# Patient Record
Sex: Female | Born: 1960 | Race: White | Hispanic: No | Marital: Married | Smoking: Current every day smoker
Health system: Southern US, Community
[De-identification: ages and names within clinical notes are randomized; demographics above are authoritative.]

## PROBLEM LIST (undated history)

## (undated) DIAGNOSIS — F419 Anxiety disorder, unspecified: Secondary | ICD-10-CM

## (undated) HISTORY — PX: TUBAL LIGATION: SHX77

---

## 2012-07-23 ENCOUNTER — Emergency Department (HOSPITAL_COMMUNITY)
Admission: EM | Admit: 2012-07-23 | Discharge: 2012-07-23 | Disposition: A | Discharge status: Home / Self Care | Payer: Self-pay | Attending: Emergency Medicine | Admitting: Emergency Medicine

## 2012-07-23 ENCOUNTER — Encounter (HOSPITAL_COMMUNITY): Payer: Self-pay | Admitting: Emergency Medicine

## 2012-07-23 ENCOUNTER — Emergency Department (HOSPITAL_COMMUNITY): Payer: Self-pay

## 2012-07-23 DIAGNOSIS — R000 Tachycardia, unspecified: Secondary | ICD-10-CM | POA: Insufficient documentation

## 2012-07-23 DIAGNOSIS — J019 Acute sinusitis, unspecified: Secondary | ICD-10-CM | POA: Insufficient documentation

## 2012-07-23 DIAGNOSIS — J329 Chronic sinusitis, unspecified: Secondary | ICD-10-CM

## 2012-07-23 DIAGNOSIS — F17200 Nicotine dependence, unspecified, uncomplicated: Secondary | ICD-10-CM | POA: Insufficient documentation

## 2012-07-23 DIAGNOSIS — F411 Generalized anxiety disorder: Secondary | ICD-10-CM | POA: Insufficient documentation

## 2012-07-23 DIAGNOSIS — R229 Localized swelling, mass and lump, unspecified: Secondary | ICD-10-CM | POA: Insufficient documentation

## 2012-07-23 HISTORY — DX: Anxiety disorder, unspecified: F41.9

## 2012-07-23 MED ORDER — SULFAMETHOXAZOLE-TRIMETHOPRIM 800-160 MG PO TABS: 1.0000 | ORAL_TABLET | Freq: Two times a day (BID) | ORAL | Status: DC

## 2012-07-23 MED ORDER — DEXTROMETHORPHAN POLISTIREX 30 MG/5ML PO LQCR: 60.0000 mg | ORAL | Status: DC | PRN

## 2012-07-23 MED ORDER — NAPROXEN 500 MG PO TABS: 500.0000 mg | ORAL_TABLET | Freq: Two times a day (BID) | ORAL | Status: DC

## 2012-07-23 NOTE — ED Notes (Signed)
Per pt, moved here from [place], found black mold in house they are renting-starting coughing, dark green mucous

## 2012-07-23 NOTE — Progress Notes (Signed)
[facility] has seen patient and provided her with a oc application. °

## 2012-07-23 NOTE — ED Provider Notes (Signed)
   History    CSN: [id number] Arrival date & time 07/23/12  4098  First MD Initiated Contact with Patient 07/23/12 [phone]     Chief Complaint  Patient presents with  . Cough   (Consider location/radiation/quality/duration/timing/severity/associated sxs/prior Treatment) HPI Comments: Patient is a 52 year old female who presents with a 1 week history of cough. Symptoms started gradually and progressively worsened since the onset. Patient reports moving here from [place] recently and found black mold in the house she is renting. Patient is is afraid she is sick due to the mold. Patient reports productive cough with dark green mucous. Patient reports associated sinus pressure and and congestion. No aggravating/alleviating factors. No other associated symptoms.   Past Medical History  Diagnosis Date  . Anxiety    Past Surgical History  Procedure Laterality Date  . Tubal ligation     No family history on file. History  Substance Use Topics  . Smoking status: Current Every Day Smoker  . Smokeless tobacco: Not on file  . Alcohol Use: No   OB History   Grav Para Term Preterm Abortions TAB SAB Ect Mult Living                 Review of Systems  HENT: Positive for sinus pressure.   Respiratory: Positive for cough.   All other systems reviewed and are negative.    Allergies  Review of patient's allergies indicates not on file.  Home Medications  No current outpatient prescriptions on file. BP 131/93  Pulse 120  Temp(Src) 98.8 F (37.1 C) (Oral)  Resp 18  SpO2 98% Physical Exam  Nursing note and vitals reviewed. Constitutional: She is oriented to person, place, and time. She appears well-developed and well-nourished. No distress.  Patient coughing throughout exam.   HENT:  Head: Normocephalic and atraumatic.  Mouth/Throat: Oropharynx is clear and moist. No oropharyngeal exudate.  Maxillary sinus tenderness to palpation. Mild periorbital swelling noted.   Eyes:  Conjunctivae and EOM are normal.  Neck: Normal range of motion.  Cardiovascular: Normal rate and regular rhythm.  Exam reveals no gallop and no friction rub.   No murmur heard. Pulmonary/Chest: Effort normal and breath sounds normal. She has no wheezes. She has no rales. She exhibits no tenderness.  Abdominal: Soft. She exhibits no distension. There is no tenderness. There is no rebound and no guarding.  Musculoskeletal: Normal range of motion.  Neurological: She is alert and oriented to person, place, and time. Coordination normal.  Speech is goal-oriented. Moves limbs without ataxia.   Skin: Skin is warm and dry.  Psychiatric: She has a normal mood and affect. Her behavior is normal.    ED Course  Procedures (including critical care time) Labs Reviewed - No data to display Dg Chest 2 View  07/23/2012   *RADIOLOGY REPORT*  Clinical Data: Cough and fever.  CHEST - 2 VIEW  Comparison: None.  Findings: Back trachea is midline.  Heart size normal.  Lungs are clear.  No pleural fluid.  IMPRESSION: No acute findings.   Original Report Authenticated By: Leanna Battles, M.D.   1. Sinusitis     MDM  9:47 AM Patient's chest xray unremarkable. Patient likely has sinusitis and is coughing due to post nasal drip. I will prescribe bactrim for sinusitis, delsym for cough, and Naproxen for pain. Patient is afebrile and tachycardic here. Patient likely tachycardic due to persistent coughing.   Emilia Beck, PA-C 07/23/12 1130

## 2012-07-23 NOTE — ED Provider Notes (Signed)
Medical screening examination/treatment/procedure(s) were performed by non-physician practitioner and as supervising physician I was immediately available for consultation/collaboration.  Jhordyn Hoopingarner R. Levent Kornegay, MD 07/23/12 1508 

## 2013-04-29 ENCOUNTER — Other Ambulatory Visit: Payer: Self-pay | Admitting: *Deleted

## 2013-04-29 DIAGNOSIS — R319 Hematuria, unspecified: Secondary | ICD-10-CM

## 2013-05-04 ENCOUNTER — Encounter: Payer: Self-pay | Admitting: Family Medicine

## 2013-05-04 ENCOUNTER — Ambulatory Visit (INDEPENDENT_AMBULATORY_CARE_PROVIDER_SITE_OTHER): Payer: BC Managed Care – PPO | Admitting: Family Medicine

## 2013-05-04 ENCOUNTER — Telehealth: Payer: Self-pay | Admitting: Family Medicine

## 2013-05-04 VITALS — BP 126/83 | HR 87 | Temp 97.9°F | Ht 70.0 in | Wt 181.0 lb

## 2013-05-04 DIAGNOSIS — Z202 Contact with and (suspected) exposure to infections with a predominantly sexual mode of transmission: Secondary | ICD-10-CM

## 2013-05-04 DIAGNOSIS — N644 Mastodynia: Secondary | ICD-10-CM

## 2013-05-04 DIAGNOSIS — M1990 Unspecified osteoarthritis, unspecified site: Secondary | ICD-10-CM | POA: Insufficient documentation

## 2013-05-04 LAB — RPR

## 2013-05-04 LAB — HEPATITIS B SURFACE ANTIGEN: HEP B S AG: NEGATIVE

## 2013-05-04 LAB — HEPATITIS C ANTIBODY: HCV AB: NEGATIVE

## 2013-05-04 MED ORDER — SULFAMETHOXAZOLE-TRIMETHOPRIM 800-160 MG PO TABS: ORAL_TABLET | ORAL | Status: AC

## 2013-05-04 MED ORDER — MELOXICAM 15 MG PO TABS: 15.0000 mg | ORAL_TABLET | Freq: Every day | ORAL | Status: AC

## 2013-05-04 NOTE — Progress Notes (Signed)
CC: Angela Pace is a 53 y.o. female is here for Establish Care and Hematuria   Subjective: HPI:  Patient complains of hematuria that is currently being worked up by her urologist. She believes cytology has been negative, she has a CT scan scheduled next week, she had a cystoscopy planned for an upcoming visit. She's currently being treated with Keflex for a presumed UTI by her urologist per her report. She's concerned that she   may have been exposed to a sexually transmitted disease that's causing non-grossly bloody urine.  She's been dealing with this for about a month now. Nothing seems to make it better or worse. It has been accompanied by pain in the pelvis only present for matter of minutes after she empties her bladder. She denies any vaginal discharge, vaginal pain, nor any other genitourinary complaints.  She reports a skin infection on her left breast that was noticed about a month ago and has been treated with 10 days of clindamycin. It is mildly painful and discharge was present when was originally diagnosed. For the past 2 weeks does not seem like it's been getting any better or worse, fortunately there has been no discharge and pain does not change with manipulation over the past 2 weeks. She does not believe that she's ever had a mammogram before. She has breast implants. She denies any breast potential changes other than the wound. It did seem to initially get much better after starting clindamycin  She complains of chronic joint pain localized in all joints of her hand and bilateral knees. It is worse first thing in the morning but improves with activity or Aleve. Pain is also worse at the end of the day the more she uses these joints. She denies any swelling redness or warmth at any of the affected joints. She denies recent or remote over exertion or trauma. No family history of rheumatoid arthritis or any other autoimmune disease. Denies catching locking or giving way of either  knee  Review of Systems - General ROS: negative for - chills, fever, night sweats, weight gain or weight loss Ophthalmic ROS: negative for - decreased vision Psychological ROS: negative for - anxiety or depression ENT ROS: negative for - hearing change, nasal congestion, tinnitus or allergies Hematological and Lymphatic ROS: negative for - bleeding problems, bruising or swollen lymph nodes Breast: Denies nipple discharge Respiratory ROS: no cough, shortness of breath, or wheezing Cardiovascular ROS: no chest pain or dyspnea on exertion Gastrointestinal ROS: no abdominal pain, change in bowel habits, or black or bloody stools Genito-Urinary ROS: negative for - genital discharge, genital ulcers, incontinence or abnormal bleeding from genitals other than that described above Musculoskeletal ROS: negative for - joint pain or muscle pain other than that described above Neurological ROS: negative for - headaches or memory loss Dermatological ROS: negative for lumps, mole changes, rash and skin lesion changes other than that described above on the left breast   Past Medical History  Diagnosis Date  . Anxiety     Past Surgical History  Procedure Laterality Date  . Tubal ligation     Family History  Problem Relation Age of Onset  . Cancer Father   . Hypertension Mother     History   Social History  . Marital Status: Married    Spouse Name: N/A    Number of Children: N/A  . Years of Education: N/A   Occupational History  . Not on file.   Social History Main Topics  . Smoking  status: Current Every Day Smoker -- 1.00 packs/day for 30 years    Types: Cigarettes  . Smokeless tobacco: Not on file  . Alcohol Use: No  . Drug Use: No  . Sexual Activity: Yes    Partners: Male   Other Topics Concern  . Not on file   Social History Narrative  . No narrative on file     Objective: BP 126/83  Pulse 87  Temp(Src) 97.9 F (36.6 C) (Oral)  Ht 5\' 10"  (1.778 m)  Wt 181 lb (82.101  kg)  BMI 25.97 kg/m2  General: Alert and Oriented, No Acute Distress HEENT: Pupils equal, round, reactive to light. Conjunctivae clear.  Moist mucous membranes pharynx unremarkable Lungs: Clear to auscultation bilaterally, no wheezing/ronchi/rales.  Comfortable work of breathing. Good air movement. Cardiac: Regular rate and rhythm. Normal S1/S2.  No murmurs, rubs, nor gallops.   Breast: The left breast at approximately 7:00 position has mild redness approximately 1 and 1/2 cm in diameter, there is no nipple retraction or any other breast architectural changes. Extremities: No peripheral edema.  Strong peripheral pulses.  Mild DIP and PIP swelling in all digits of both hands Mental Status: No depression, anxiety, nor agitation. Skin: Warm and dry.  Assessment & Plan: Angela Pace was seen today for establish care and hematuria.  Diagnoses and associated orders for this visit:  Breast pain, left - sulfamethoxazole-trimethoprim (SEPTRA DS) 800-160 MG per tablet; One by mouth twice a day for ten days. - MM Digital Diagnostic Bilat; Future  Osteoarthritis - meloxicam (MOBIC) 15 MG tablet; Take 1 tablet (15 mg total) by mouth daily.  Exposure to STD - GC/Chlamydia Probe Amp - HIV antibody - RPR - Hepatitis B surface antigen - Hepatitis C antibody     breast pain: Discussed that her discomfort and appearance could be simply due to cellulitis that has not fully resolved therefore start Bactrim however this does warrant diagnostic mammogram to rule out malignancy, [place]: Start as needed meloxicam, we'll proceed imaging if not improving after one to 2 weeks Exposure to STD: Check and gonorrhea and Chlamydia given hematuria, she would like to expand STD screening to also involve the above   Return in about 4 weeks (around 06/01/2013).

## 2013-05-04 NOTE — Telephone Encounter (Signed)
Pt called. Since she's on a lot of antibiotics between Dr Ivan AnchorsHommel and [place], can she get med to prevent a yeast infection,

## 2013-05-05 LAB — GC/CHLAMYDIA PROBE AMP
CT Probe RNA: NEGATIVE
GC Probe RNA: NEGATIVE

## 2013-05-05 LAB — HIV ANTIBODY (ROUTINE TESTING W REFLEX): HIV: NONREACTIVE

## 2013-05-05 MED ORDER — FLUCONAZOLE 150 MG PO TABS: ORAL_TABLET | ORAL | Status: AC

## 2013-05-05 NOTE — Telephone Encounter (Signed)
Certainly, Diflucan sent to her [facility].

## 2013-05-05 NOTE — Telephone Encounter (Signed)
Pt.notified

## 2013-05-11 ENCOUNTER — Inpatient Hospital Stay
Admission: RE | Admit: 2013-05-11 | Discharge: 2013-05-11 | Disposition: A | Discharge status: Home / Self Care | Payer: Self-pay | Source: Ambulatory Visit | Attending: *Deleted | Admitting: *Deleted

## 2013-05-11 ENCOUNTER — Other Ambulatory Visit: Payer: Self-pay

## 2013-05-18 ENCOUNTER — Ambulatory Visit
Admission: RE | Admit: 2013-05-18 | Discharge: 2013-05-18 | Disposition: A | Discharge status: Home / Self Care | Payer: BC Managed Care – PPO | Source: Ambulatory Visit | Attending: *Deleted | Admitting: *Deleted

## 2013-05-18 ENCOUNTER — Ambulatory Visit
Admission: RE | Admit: 2013-05-18 | Discharge: 2013-05-18 | Disposition: A | Discharge status: Home / Self Care | Payer: BC Managed Care – PPO | Source: Ambulatory Visit | Attending: Family Medicine | Admitting: Family Medicine

## 2013-05-18 DIAGNOSIS — R319 Hematuria, unspecified: Secondary | ICD-10-CM

## 2013-05-18 DIAGNOSIS — N644 Mastodynia: Secondary | ICD-10-CM

## 2013-05-18 MED ORDER — IOHEXOL 300 MG/ML  SOLN
125.0000 mL | Freq: Once | INTRAMUSCULAR | Status: AC | PRN
  Administered 2013-05-18: 125 mL via INTRAVENOUS

## 2014-04-28 IMAGING — CT CT ABD-PEL WO/W CM
4 of 10 series · 11 of 36 positions shown, 14 images · IV contrast ([ID] [id number])
Comparison: None.

CLINICAL DATA: Microscopic hematuria.

EXAM:
CT ABDOMEN AND PELVIS WITHOUT AND WITH CONTRAST
TECHNIQUE: Multidetector CT imaging of the abdomen and pelvis was performed
without contrast material in one or both body regions, followed by
contrast material(s) and further sections in one or both body
regions.
CONTRAST:  125mL OMNIPAQUE IOHEXOL 300 MG/ML  SOLN

[Series 3: abd/pelvis w/o · axial · non-contrast · 0.72mm/px · z∈[-330,-100]mm · 3 of 92 slices shown]
[im 23/92  soft-tissue]
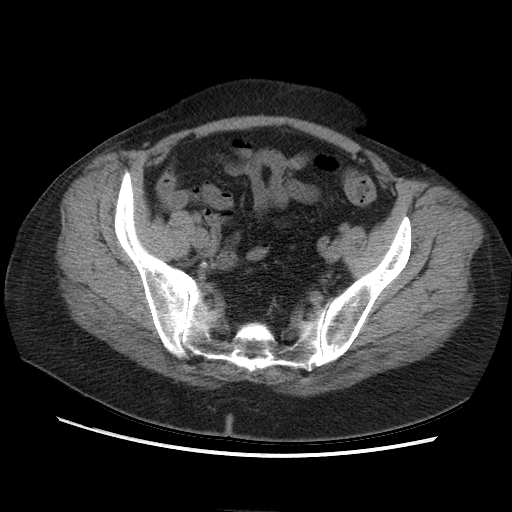
[im 46/92  soft-tissue]
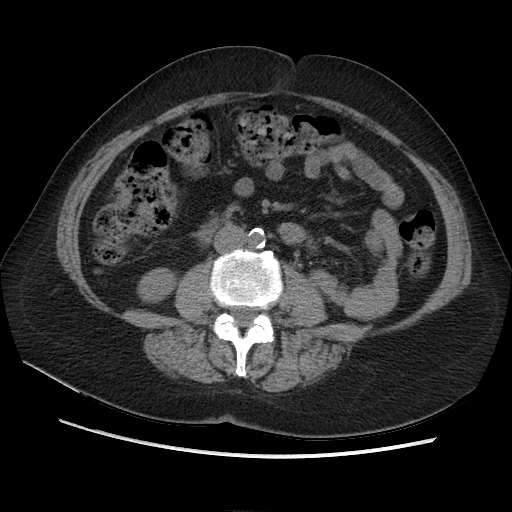
[im 69/92  soft-tissue]
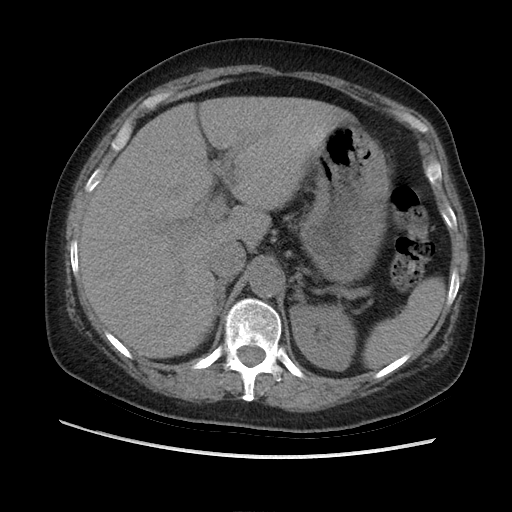

[Series 5: abd/pelvis with · axial · 0.72mm/px · z∈[-278,-133]mm · 2 of 89 slices shown]
[im 30/89  soft-tissue]
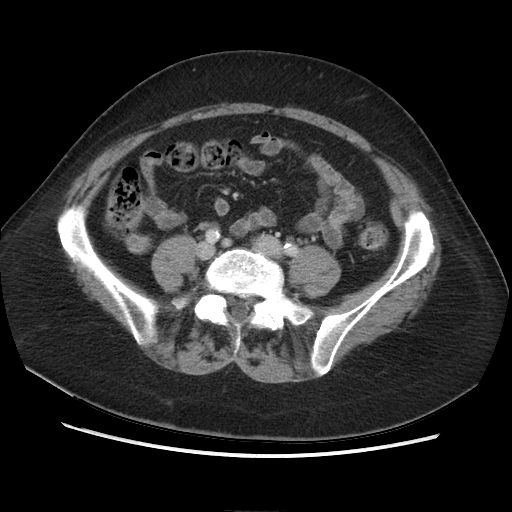
[im 59/89  soft-tissue]
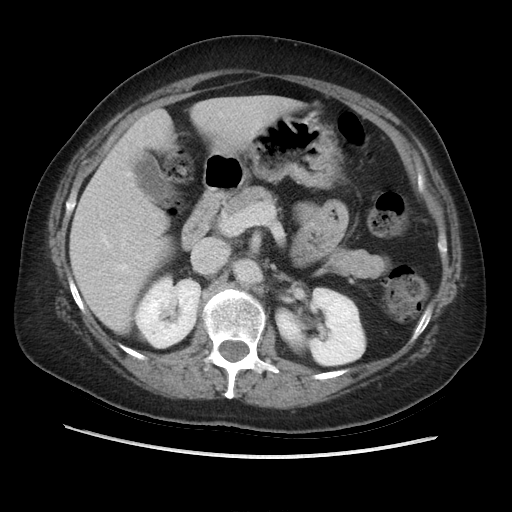

[Series 9: abd/pelvis prone · axial · 0.72mm/px · z∈[-326,-160]mm · 2 of 101 slices shown, 5 images]
[im 34/101  soft-tissue]
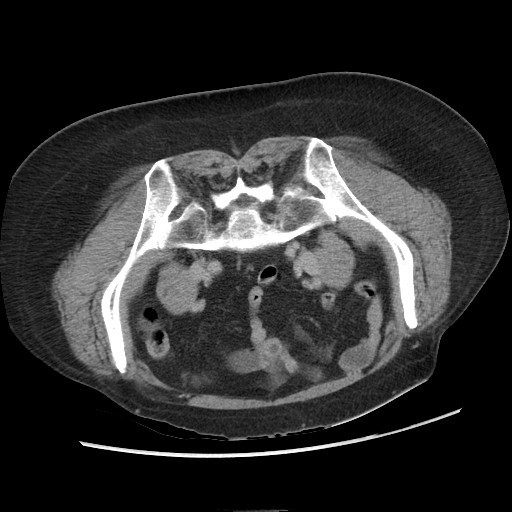
[im 34/101  lung]
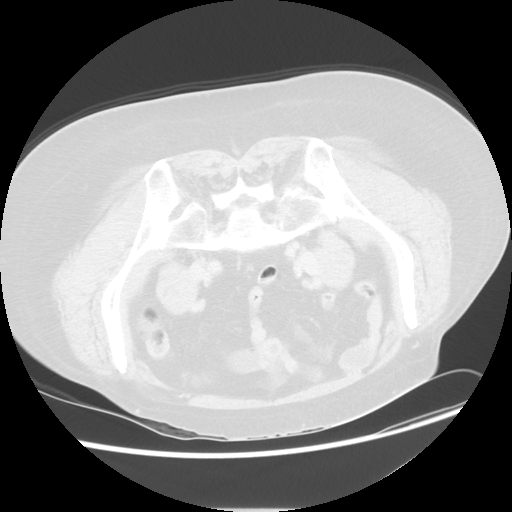
[im 34/101  bone]
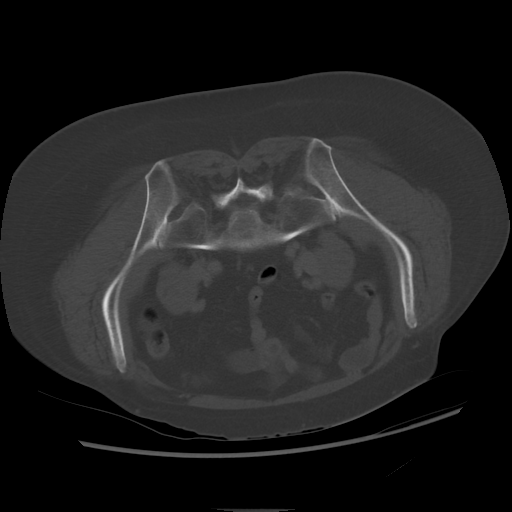
[im 67/101  soft-tissue]
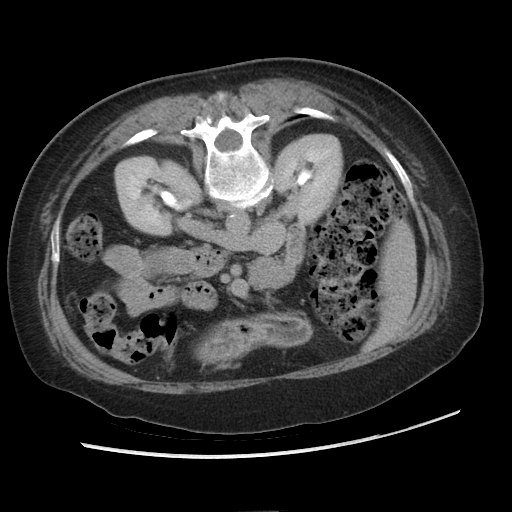
[im 67/101  lung]
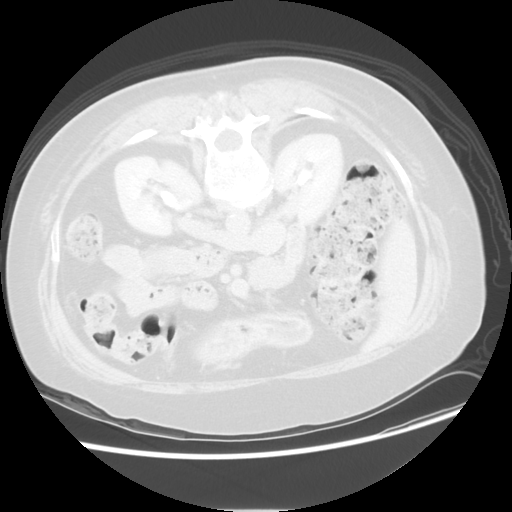

[Series 602: sagittal body · sagittal · 0.97mm/px · 4 of 149 slices shown]
[im 30/149  soft-tissue]
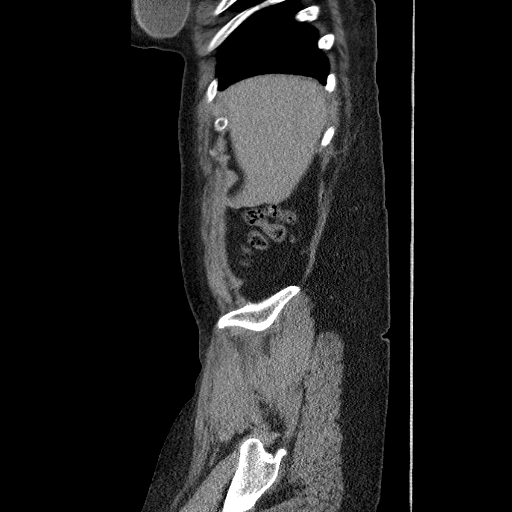
[im 60/149  soft-tissue]
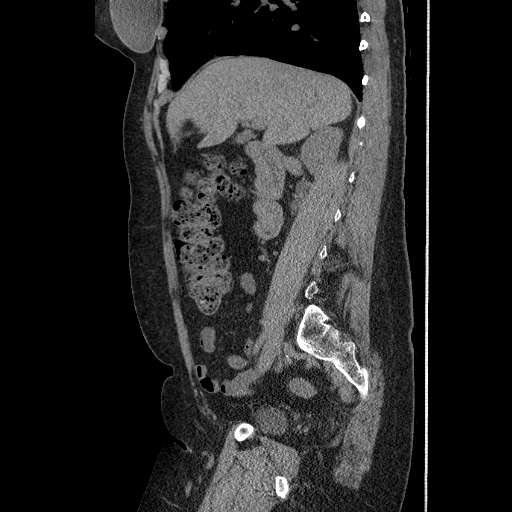
[im 89/149  soft-tissue]
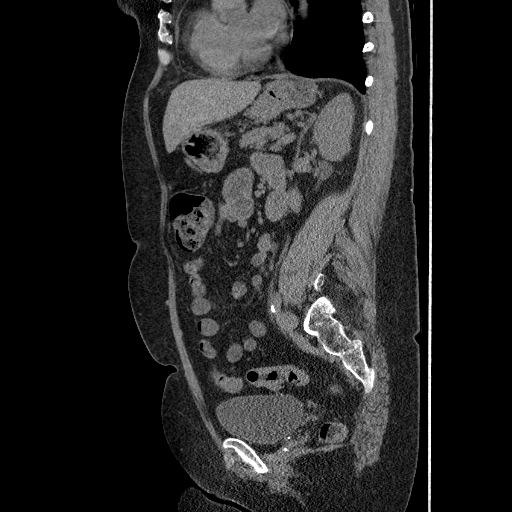
[im 119/149  soft-tissue]
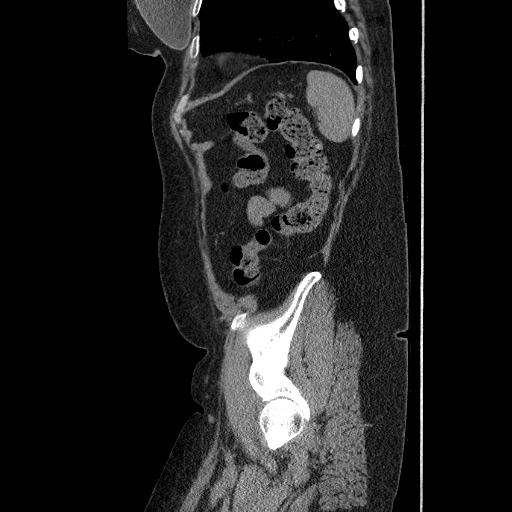

[11 of 36 positions shown; findings below may reference images not displayed]

FINDINGS: Bilateral breast implants are partially visualized. Calcified
granulomas are noted in the right lower lobe. Visualized lung bases
are clear.

Punctate calcifications are present in the liver and spleen, likely
reflecting prior granulomatous infection. The gallbladder, adrenal
glands, and pancreas have an unremarkable enhanced appearance. There
is no hydronephrosis. No calculi are identified in either kidney or
within either ureter or bladder. Delayed images are without evidence
filling defects within the renal collecting systems or proximal or
mid ureters. Distal ureters are not opacified.

The small and large bowel are nondilated. Appendix is identified in
the right lower quadrant and is unremarkable. Uterus and ovaries are
identified. No pelvic mass is seen. Moderate aortoiliac
atherosclerotic calcification is present. No free fluid or enlarged
lymph nodes are identified. Mild-to-moderate multilevel spondylosis
is present in the lower thoracic and lumbar spine. Areas of
sclerosis adjacent to the T9 and T10 endplates is most likely
degenerative. Multiple small Schmorl's nodes are noted. There is
mild depression of the left lateral aspect of the L2 superior
endplate, likely chronic.
IMPRESSION: 1. No urinary tract calculi or mass identified.
2. Moderate abdominal atherosclerosis.
